# Patient Record
Sex: Male | Born: 1935 | Race: White | Hispanic: No
Health system: Southern US, Community
[De-identification: ages and names within clinical notes are randomized; demographics above are authoritative.]

---

## 2004-01-30 ENCOUNTER — Ambulatory Visit: Payer: Self-pay | Admitting: Internal Medicine

## 2004-03-01 ENCOUNTER — Ambulatory Visit: Payer: Self-pay | Admitting: Internal Medicine

## 2004-03-31 ENCOUNTER — Ambulatory Visit: Payer: Self-pay | Admitting: Internal Medicine

## 2004-05-01 ENCOUNTER — Ambulatory Visit: Payer: Self-pay | Admitting: Internal Medicine

## 2004-06-01 ENCOUNTER — Ambulatory Visit: Payer: Self-pay | Admitting: Internal Medicine

## 2004-06-29 ENCOUNTER — Ambulatory Visit: Payer: Self-pay | Admitting: Internal Medicine

## 2004-07-30 ENCOUNTER — Ambulatory Visit: Payer: Self-pay | Admitting: Internal Medicine

## 2004-08-29 ENCOUNTER — Ambulatory Visit: Payer: Self-pay | Admitting: Internal Medicine

## 2004-09-29 ENCOUNTER — Ambulatory Visit: Payer: Self-pay | Admitting: Internal Medicine

## 2004-11-15 ENCOUNTER — Ambulatory Visit: Payer: Self-pay | Admitting: Internal Medicine

## 2004-11-29 ENCOUNTER — Ambulatory Visit: Payer: Self-pay | Admitting: Internal Medicine

## 2005-01-18 ENCOUNTER — Ambulatory Visit: Payer: Self-pay | Admitting: Internal Medicine

## 2005-01-29 ENCOUNTER — Ambulatory Visit: Payer: Self-pay | Admitting: Internal Medicine

## 2005-03-14 ENCOUNTER — Ambulatory Visit: Payer: Self-pay | Admitting: Internal Medicine

## 2005-03-31 ENCOUNTER — Ambulatory Visit: Payer: Self-pay | Admitting: Internal Medicine

## 2005-05-16 ENCOUNTER — Ambulatory Visit: Payer: Self-pay | Admitting: Internal Medicine

## 2005-06-01 ENCOUNTER — Ambulatory Visit: Payer: Self-pay | Admitting: Internal Medicine

## 2005-09-12 ENCOUNTER — Ambulatory Visit: Payer: Self-pay | Admitting: Internal Medicine

## 2005-09-29 ENCOUNTER — Ambulatory Visit: Payer: Self-pay | Admitting: Internal Medicine

## 2005-10-12 ENCOUNTER — Ambulatory Visit: Payer: Self-pay | Admitting: Gastroenterology

## 2006-03-30 ENCOUNTER — Ambulatory Visit: Payer: Self-pay | Admitting: Internal Medicine

## 2008-09-27 ENCOUNTER — Emergency Department: Payer: Self-pay | Admitting: Emergency Medicine

## 2008-10-05 ENCOUNTER — Ambulatory Visit: Payer: Self-pay | Admitting: Emergency Medicine

## 2009-10-22 ENCOUNTER — Inpatient Hospital Stay: Payer: Self-pay | Admitting: Internal Medicine

## 2010-03-26 ENCOUNTER — Inpatient Hospital Stay: Payer: Self-pay | Admitting: Internal Medicine

## 2010-03-28 ENCOUNTER — Ambulatory Visit: Payer: Self-pay | Admitting: Internal Medicine

## 2010-03-31 ENCOUNTER — Ambulatory Visit: Payer: Self-pay | Admitting: Internal Medicine

## 2010-04-07 ENCOUNTER — Ambulatory Visit: Payer: Self-pay | Admitting: Internal Medicine

## 2010-05-01 ENCOUNTER — Ambulatory Visit: Payer: Self-pay | Admitting: Internal Medicine

## 2010-06-01 ENCOUNTER — Ambulatory Visit: Payer: Self-pay | Admitting: Internal Medicine

## 2011-03-06 ENCOUNTER — Ambulatory Visit: Payer: Self-pay | Admitting: Internal Medicine

## 2011-04-03 ENCOUNTER — Ambulatory Visit: Payer: Self-pay | Admitting: Oncology

## 2011-04-12 ENCOUNTER — Ambulatory Visit: Payer: Self-pay | Admitting: Oncology

## 2011-04-18 ENCOUNTER — Ambulatory Visit: Payer: Self-pay | Admitting: Oncology

## 2011-05-02 ENCOUNTER — Ambulatory Visit: Payer: Self-pay | Admitting: Oncology

## 2011-05-25 LAB — CBC CANCER CENTER
Eosinophil #: 0.1 x10 3/mm (ref 0.0–0.7)
Eosinophil %: 1.3 %
Lymphocyte #: 1.6 x10 3/mm (ref 1.0–3.6)
MCHC: 34.4 g/dL (ref 32.0–36.0)
MCV: 95 fL (ref 80–100)
Monocyte %: 9.2 %
Platelet: 122 x10 3/mm — ABNORMAL LOW (ref 150–440)
RBC: 3.94 10*6/uL — ABNORMAL LOW (ref 4.40–5.90)
RDW: 14.9 % — ABNORMAL HIGH (ref 11.5–14.5)
WBC: 7.5 x10 3/mm (ref 3.8–10.6)

## 2011-06-02 ENCOUNTER — Ambulatory Visit: Payer: Self-pay | Admitting: Oncology

## 2011-06-30 ENCOUNTER — Ambulatory Visit: Payer: Self-pay | Admitting: Oncology

## 2011-07-31 ENCOUNTER — Ambulatory Visit: Payer: Self-pay | Admitting: Oncology

## 2011-12-04 ENCOUNTER — Ambulatory Visit: Payer: Self-pay | Admitting: Oncology

## 2011-12-31 ENCOUNTER — Ambulatory Visit: Payer: Self-pay | Admitting: Oncology

## 2013-09-29 ENCOUNTER — Ambulatory Visit: Payer: Self-pay | Admitting: Internal Medicine

## 2013-10-18 ENCOUNTER — Inpatient Hospital Stay: Payer: Self-pay | Admitting: Internal Medicine

## 2013-10-18 LAB — CBC
HCT: 35.5 % — ABNORMAL LOW (ref 40.0–52.0)
HGB: 11.4 g/dL — ABNORMAL LOW (ref 13.0–18.0)
MCH: 28.8 pg (ref 26.0–34.0)
MCHC: 32 g/dL (ref 32.0–36.0)
MCV: 90 fL (ref 80–100)
PLATELETS: 144 10*3/uL — AB (ref 150–440)
RBC: 3.94 10*6/uL — AB (ref 4.40–5.90)
RDW: 14.4 % (ref 11.5–14.5)
WBC: 13.1 10*3/uL — ABNORMAL HIGH (ref 3.8–10.6)

## 2013-10-18 LAB — BASIC METABOLIC PANEL
Anion Gap: 7 (ref 7–16)
BUN: 31 mg/dL — ABNORMAL HIGH (ref 7–18)
CHLORIDE: 102 mmol/L (ref 98–107)
Calcium, Total: 8.7 mg/dL (ref 8.5–10.1)
Co2: 28 mmol/L (ref 21–32)
Creatinine: 2.03 mg/dL — ABNORMAL HIGH (ref 0.60–1.30)
EGFR (African American): 33 — ABNORMAL LOW
GFR CALC NON AF AMER: 29 — AB
GLUCOSE: 138 mg/dL — AB (ref 65–99)
Osmolality: 283 (ref 275–301)
POTASSIUM: 3.9 mmol/L (ref 3.5–5.1)
Sodium: 137 mmol/L (ref 136–145)

## 2013-10-18 LAB — CK TOTAL AND CKMB (NOT AT ARMC)
CK, TOTAL: 77 U/L
CK, Total: 100 U/L
CK-MB: 1 ng/mL (ref 0.5–3.6)
CK-MB: 1.1 ng/mL (ref 0.5–3.6)

## 2013-10-18 LAB — URINALYSIS, COMPLETE
BACTERIA: NONE SEEN
Bilirubin,UR: NEGATIVE
Blood: NEGATIVE
Glucose,UR: NEGATIVE mg/dL (ref 0–75)
KETONE: NEGATIVE
LEUKOCYTE ESTERASE: NEGATIVE
NITRITE: NEGATIVE
PH: 5 (ref 4.5–8.0)
Protein: 30
RBC,UR: 2 /HPF (ref 0–5)
SPECIFIC GRAVITY: 1.018 (ref 1.003–1.030)
SQUAMOUS EPITHELIAL: NONE SEEN

## 2013-10-18 LAB — PHOSPHORUS: PHOSPHORUS: 2.7 mg/dL (ref 2.5–4.9)

## 2013-10-18 LAB — HEPATIC FUNCTION PANEL A (ARMC)
ALBUMIN: 3.4 g/dL (ref 3.4–5.0)
ALK PHOS: 82 U/L
ALT: 19 U/L (ref 12–78)
BILIRUBIN DIRECT: 0.3 mg/dL — AB (ref 0.00–0.20)
BILIRUBIN TOTAL: 1.2 mg/dL — AB (ref 0.2–1.0)
SGOT(AST): 27 U/L (ref 15–37)
Total Protein: 7.7 g/dL (ref 6.4–8.2)

## 2013-10-18 LAB — TROPONIN I
TROPONIN-I: 0.2 ng/mL — AB
Troponin-I: 0.15 ng/mL — ABNORMAL HIGH

## 2013-10-18 LAB — LIPASE, BLOOD: LIPASE: 112 U/L (ref 73–393)

## 2013-10-18 LAB — MAGNESIUM: Magnesium: 2.1 mg/dL

## 2013-10-19 LAB — BASIC METABOLIC PANEL
Anion Gap: 6 — ABNORMAL LOW (ref 7–16)
BUN: 31 mg/dL — ABNORMAL HIGH (ref 7–18)
CO2: 26 mmol/L (ref 21–32)
Calcium, Total: 8.1 mg/dL — ABNORMAL LOW (ref 8.5–10.1)
Chloride: 105 mmol/L (ref 98–107)
Creatinine: 1.88 mg/dL — ABNORMAL HIGH (ref 0.60–1.30)
EGFR (African American): 36 — ABNORMAL LOW
EGFR (Non-African Amer.): 31 — ABNORMAL LOW
Glucose: 107 mg/dL — ABNORMAL HIGH (ref 65–99)
Osmolality: 281 (ref 275–301)
Potassium: 3.7 mmol/L (ref 3.5–5.1)
Sodium: 137 mmol/L (ref 136–145)

## 2013-10-19 LAB — CBC WITH DIFFERENTIAL/PLATELET
Basophil #: 0 10*3/uL (ref 0.0–0.1)
Basophil %: 0.3 %
EOS ABS: 0 10*3/uL (ref 0.0–0.7)
Eosinophil %: 0.3 %
HCT: 30.1 % — AB (ref 40.0–52.0)
HGB: 10.1 g/dL — AB (ref 13.0–18.0)
LYMPHS ABS: 2 10*3/uL (ref 1.0–3.6)
Lymphocyte %: 17.9 %
MCH: 30.2 pg (ref 26.0–34.0)
MCHC: 33.5 g/dL (ref 32.0–36.0)
MCV: 90 fL (ref 80–100)
Monocyte #: 1.4 x10 3/mm — ABNORMAL HIGH (ref 0.2–1.0)
Monocyte %: 12.9 %
NEUTROS ABS: 7.5 10*3/uL — AB (ref 1.4–6.5)
Neutrophil %: 68.6 %
Platelet: 110 10*3/uL — ABNORMAL LOW (ref 150–440)
RBC: 3.35 10*6/uL — ABNORMAL LOW (ref 4.40–5.90)
RDW: 14.6 % — ABNORMAL HIGH (ref 11.5–14.5)
WBC: 10.9 10*3/uL — AB (ref 3.8–10.6)

## 2013-10-19 LAB — CK TOTAL AND CKMB (NOT AT ARMC)
CK, Total: 77 U/L
CK-MB: 1.2 ng/mL (ref 0.5–3.6)

## 2013-10-19 LAB — TROPONIN I: TROPONIN-I: 0.18 ng/mL — AB

## 2013-10-19 LAB — DIGOXIN LEVEL: Digoxin: 2 ng/mL

## 2013-10-20 LAB — PLATELET COUNT: PLATELETS: 116 10*3/uL — AB (ref 150–440)

## 2013-10-20 LAB — URINE CULTURE

## 2013-10-21 LAB — BASIC METABOLIC PANEL
Anion Gap: 9 (ref 7–16)
BUN: 47 mg/dL — AB (ref 7–18)
CALCIUM: 8.5 mg/dL (ref 8.5–10.1)
CREATININE: 1.92 mg/dL — AB (ref 0.60–1.30)
Chloride: 108 mmol/L — ABNORMAL HIGH (ref 98–107)
Co2: 22 mmol/L (ref 21–32)
EGFR (Non-African Amer.): 31 — ABNORMAL LOW
GFR CALC AF AMER: 35 — AB
Glucose: 130 mg/dL — ABNORMAL HIGH (ref 65–99)
OSMOLALITY: 292 (ref 275–301)
Potassium: 4.2 mmol/L (ref 3.5–5.1)
Sodium: 139 mmol/L (ref 136–145)

## 2013-10-23 LAB — CULTURE, BLOOD (SINGLE)

## 2013-10-29 ENCOUNTER — Ambulatory Visit: Payer: Self-pay | Admitting: Internal Medicine

## 2013-12-30 DEATH — deceased

## 2014-08-22 NOTE — Consult Note (Signed)
   Comments   Spoke with daughter, Rolene CourseKaren Garrison, by phone.  The plan is for pt to return home and get him back in his home and familiar environment. Family will continue with pt's normal 24/7 caregivers in home. Daughter agrees father is not currently hospice appropriate but understands that if pt declines once home, he may be appropriate for home hospice.  Family would speak to pt's PCP and would ask for a consult at that time.  Electronic Signatures: Reather LaurenceMantzouris, Stephen J (NP)  (Signed 24-Jun-15 14:24)  Authored: Palliative Care Phifer, Harriett SineNancy (MD)  (Signed 24-Jun-15 21:38)  Authored: Palliative Care   Last Updated: 24-Jun-15 21:38 by Phifer, Harriett SineNancy (MD)

## 2014-08-22 NOTE — H&P (Signed)
PATIENT NAME:  Cory Chavez, Cory Chavez MR#:  [id number] DATE OF BIRTH:  09-27-1924  DATE OF ADMISSION:  10/18/2013  REFERRING PHYSICIAN: Dr. York Cerise  FAMILY PHYSICIAN: Dr. Yates Decamp III   REASON FOR ADMISSION: Altered mental status.   HISTORY OF PRESENT ILLNESS: The patient is an 81 year old male with a history of Alzheimer's dementia, benign hypertension, hyperlipidemia, and coronary artery disease. He  lives at home with caretakers that look after him 24 hours a day, 7 days a week. Has chronic confusion. Presents to the Emergency Room today with worsening confusion, lethargy, and tremor. Has been having fevers. In the Emergency Room, the patient was febrile and extremely confused. His white count was elevated. His troponin was elevated. He is now admitted for further evaluation. The patient is unable to give history.   PAST MEDICAL HISTORY: 1.  ASCVD, status post PTCA with stent placement.  2.  Alzheimer's dementia.  3.  Benign hypertension.  4.  Hyperlipidemia.  5.  Chronic bronchitis.  6.  History of renal failure.  7.  Osteoarthritis.  8.  History of lymphoid cancer of unclear etiology.   MEDICATIONS ON ADMISSION:  1.  Slow Fe 1 p.o. b.i.d.  2.  Nexium 40 mg p.o. b.i.d.  3.  Multivitamin 1 p.o. daily.  4.  Meclizine 25 mg p.o. t.i.d.  5.  Lasix 20 mg p.o. daily.  6.  Lanoxin 0.125 mg p.o. daily. 7.  Pletal 100 mg p.o. b.i.d.  8.  Coreg 6.25 mg p.o. b.i.d.  9.  Cardura 4 mg p.o. daily.  10.  Ativan 0.5 mg p.o. t.i.d. p.r.n.  11.  Aspirin 81 mg p.o. daily.  12.  Aricept 5 mg p.o. at bedtime.   ALLERGIES: PENICILLIN and AMBIEN.   SOCIAL HISTORY: The patient is a former smoker. No history of alcohol abuse.   FAMILY HISTORY: Noncontributory.   REVIEW OF SYSTEMS:  Unable to obtain.   PHYSICAL EXAMINATION: GENERAL: The patient is confused, agitated, in no acute distress.  VITAL SIGNS: Remarkable for a blood pressure of 140/66, heart rate 95, respiratory rate 24, temperature of  101.8, sat 97% on room air.  HEENT: Normocephalic, atraumatic. Pupils equally round and reactive to light and accommodation. Extraocular movements are intact. Sclerae not icteric. Conjunctivae are clear.  Oropharynx is dry, but clear.  NECK: Supple, without JVD. No adenopathy or thyromegaly is noted.  LUNGS: Reveal basilar rhonchi, without wheezes or rales. No dullness. Respiratory effort is normal.  CARDIAC: Regular rate and rhythm, with an occasional premature beat. Normal S1, S2. No significant rubs or gallops. PMI is nondisplaced. Chest wall is nontender.  ABDOMEN: Soft, nontender, with normoactive bowel sounds. No organomegaly or masses were appreciated. No hernias or bruits were noted.  EXTREMITIES: Without clubbing or cyanosis, 1+ edema was present.  SKIN: Warm and dry, without rash or lesions.  NEUROLOGIC: Exam revealed cranial nerves II through XII grossly intact. Deep tendon reflexes were symmetric. Motor and sensory exams nonfocal.  PSYCHIATRIC: Exam revealed a patient who was alert, but disoriented to person, place and time, and could not answer questions appropriately.   LABORATORY DATA:  EKG revealed sinus rhythm with PVCs and a left bundle branch block. Chest x-ray revealed no evidence of infiltrate. Urinalysis was unremarkable. His white count was 13.1, with a hemoglobin of 11.4. Troponin was 0.15. Glucose 138, with a BUN of 31, creatinine 2.03, and a GFR of 29.   ASSESSMENT: 1.  Febrile illness of unclear significance.  2.  Elevated troponin.  3.  Abnormal EKG.  4.  Altered mental status.  5.  Dehydration.  6.  Stage III chronic kidney disease.   PLAN: The patient will be admitted as a FULL CODE according to the power of attorney. She will discuss his code status further with family, but for now he is FULL CODE. He will be maintained on IV fluids with IV antibiotics. Cultures have been sent. Will follow serial cardiac enzymes and obtain an echocardiogram. Will consult  Cardiology because of his troponin elevation and abnormal EKG. Will obtain a social work consult for placement and a palliative care consult, given the patient's progressive dementia and history of heart disease. Follow up routine labs in the morning. Soft diet for now. Further treatment and evaluation will depend upon the patient's progress.   Total time spent on this patient was 50 minutes.    ____________________________ Duane LopeJeffrey D. Judithann SheenSparks, MD jds:mr D: 10/18/2013 18:04:35 ET T: 10/18/2013 19:12:53 ET JOB#: [id number]  cc: Duane LopeJeffrey D. Judithann SheenSparks, MD, <Dictator> John B. Danne HarborWalker III, MD  Darron Stuck Rodena Medin Meleni Delahunt MD ELECTRONICALLY SIGNED 10/19/2013 10:26

## 2014-08-22 NOTE — Discharge Summary (Signed)
PATIENT NAME:  Cory Chavez, Cory Chavez MR#:  [id number] DATE OF BIRTH:  [birth year]  DATE OF ADMISSION:  10/18/2013 DATE OF DISCHARGE:  10/23/2013  DISCHARGE DIAGNOSES:  1.  Encephalopathy. 2.  Monoarticular arthritis, likely gout, responding to steroids.  3.  Febrile illness, nonspecific source, rule out ehrlichiosis.  4.  Digoxin toxicity.  5.  Accelerated  hypertension, likely from encephalopathy.  6.  Dementia, progressive, nearing end-stage.   DISCHARGE MEDICATIONS: [facility]. Briefly, we will stop the digoxin, given upper limit normal levels and lack of atrial fibrillation and his renal insufficiency. He will do 3 more days of doxycycline, 5 more days of prednisone at 20 mg.   HISTORY AND PHYSICAL: Please see detailed history and physical done on admission.   HOSPITAL COURSE: The patient was admitted, more confused, febrile. Right wrist was swollen and red, very painful. Steroids were started, he did well. Work-up of the fever was, otherwise, negative. He was covered with Levaquin and doxycycline. He improved, though he is still more confused than baseline.  Could not ambulate, had been ambulating prior to this. His renal function was around baseline, GFR of 30-40. He had echocardiogram that showed decreased LV function, though he had no signs of acute CHF in the hospital. Troponin levels were minimally elevated on admission, likely secondary to his renal insufficiency without signs of MI, no chest pain, shortness of breath, etc.  We discharged with home health, 24/7 sitters have already been on the case. He will follow-up soon with Dr. Dan HumphreysWalker, his primary care.   TIME SPENT: It took approximately 35 minutes to do all discharge tasks.    ____________________________ Marya AmslerMarshall W. Dareen PianoAnderson, MD mwa:ts D: 10/23/2013 07:51:33 ET T: 10/23/2013 12:44:00 ET JOB#: [id number]  cc: Marya AmslerMarshall W. Dareen PianoAnderson, MD, <Dictator> Lauro RegulusMARSHALL W ANDERSON MD ELECTRONICALLY SIGNED 10/27/2013 7:47

## 2014-08-22 NOTE — Consult Note (Signed)
Brief Consult Note: Comments: 81 yo male with  a history of Alzheimer's dementia, benign hypertension, hyperlipidemia, and coronary artery disease who was admitted with progressive weakness and confusion. Not able to give history . Caregivers report urinary frequency and possible fevers. Does not appear to be in chf clinnically. CXR does not suggest signficant chf. Ruled out for mi thus far. Continue to treat possible fuo. Does not appear to requrie further cardiac workup.  Electronic Signatures: Dalia HeadingFath, Kenneth A (MD)  (Signed 22-Jun-15 07:10)  Authored: Brief Consult Note   Last Updated: 22-Jun-15 07:10 by Dalia HeadingFath, Kenneth A (MD)

## 2014-08-22 NOTE — Consult Note (Signed)
   Comments   Spoke with daughter, Rolene CourseKaren Garrison, by phone.  Clydie BraunKaren is HCPOA and agrees to make father a limited code.  Meaning pt will only receive shocks and compression.  Clydie BraunKaren wishes pt not to be intubated.  Clydie BraunKaren also states this is something the whole family would agree upon.  Electronic Signatures: Reather LaurenceMantzouris, Stephen J (NP)  (Signed 22-Jun-15 13:26)  Authored: Palliative Care Phifer, Harriett SineNancy (MD)  (Signed 22-Jun-15 20:49)  Authored: Palliative Care   Last Updated: 22-Jun-15 20:49 by Phifer, Harriett SineNancy (MD)

## 2014-10-27 IMAGING — CR RIGHT HAND - COMPLETE 3+ VIEW
1 series · 3 of 3 positions shown · non-contrast
Comparison: None.

CLINICAL DATA: Right hand pain and swelling.

EXAM:
RIGHT HAND - COMPLETE 3+ VIEW

[Series 1: pa · 0.17mm/px · 3 of 3 slices shown]
[im 1/3]
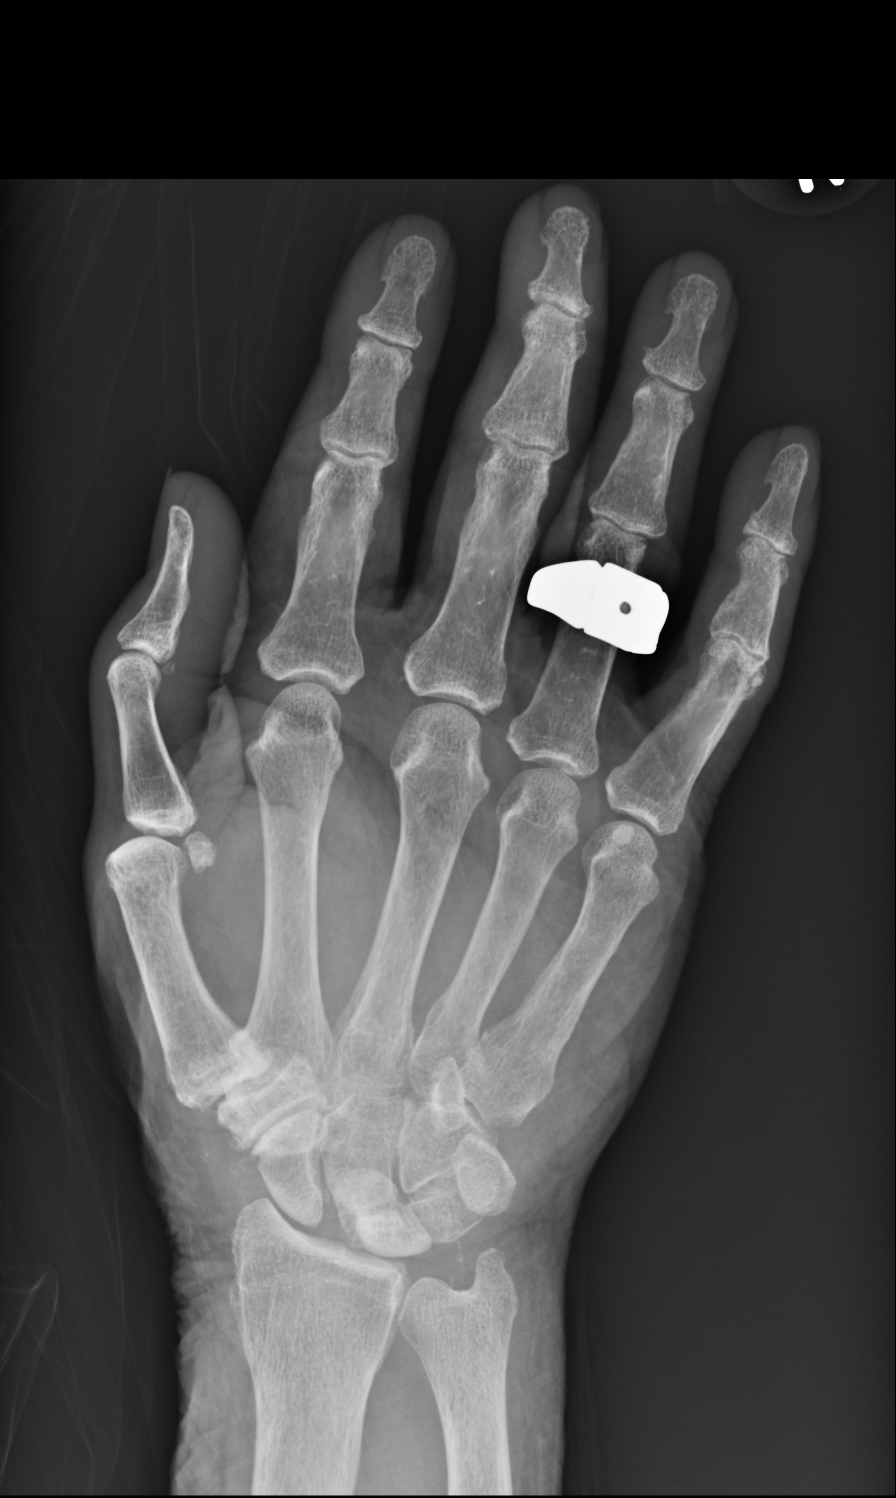
[im 2/3]
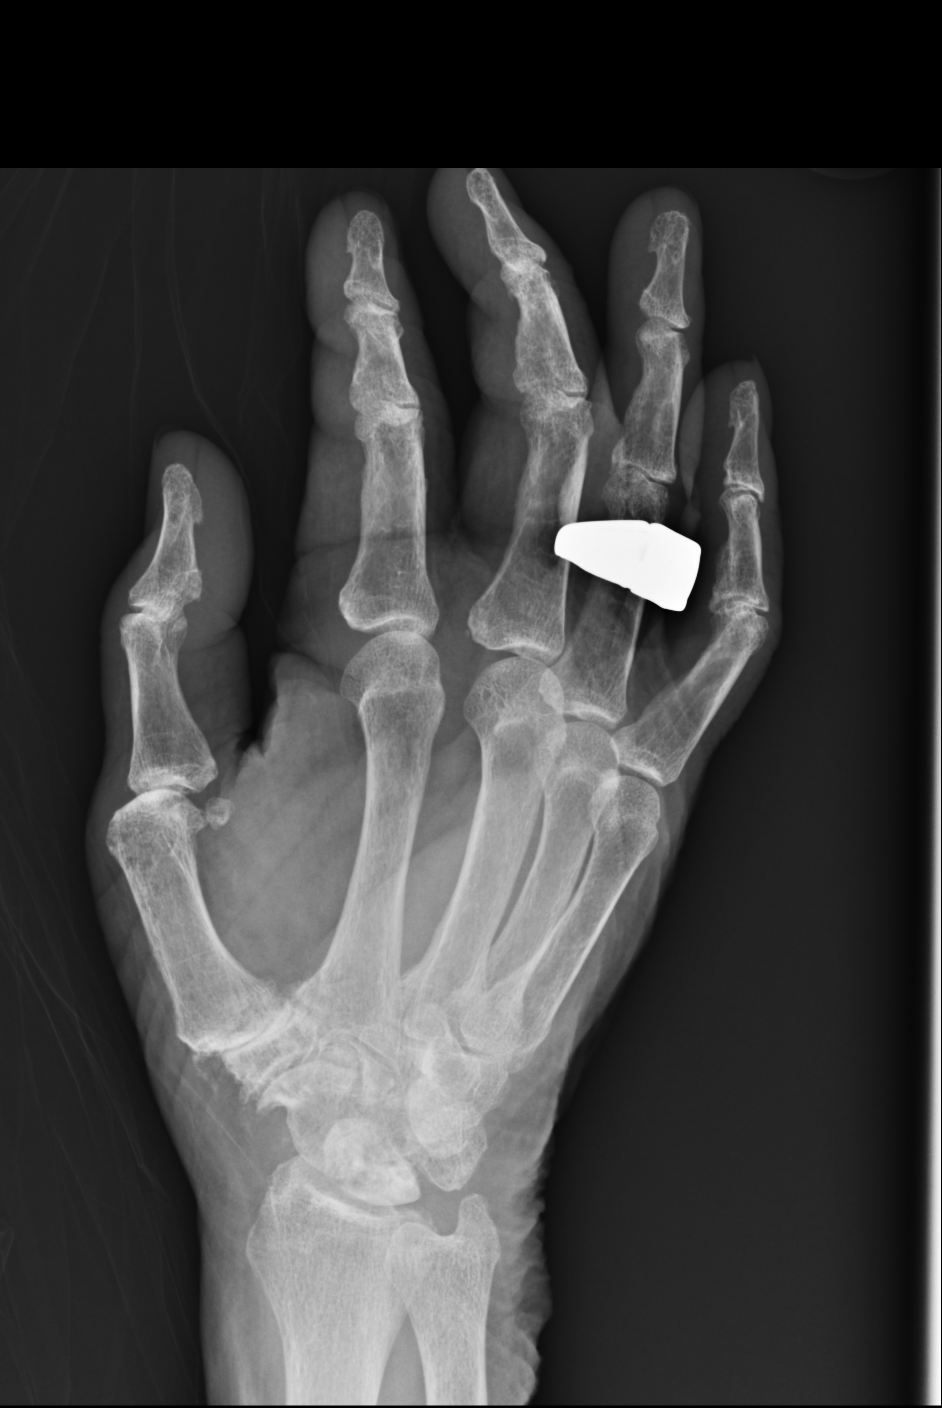
[im 3/3]
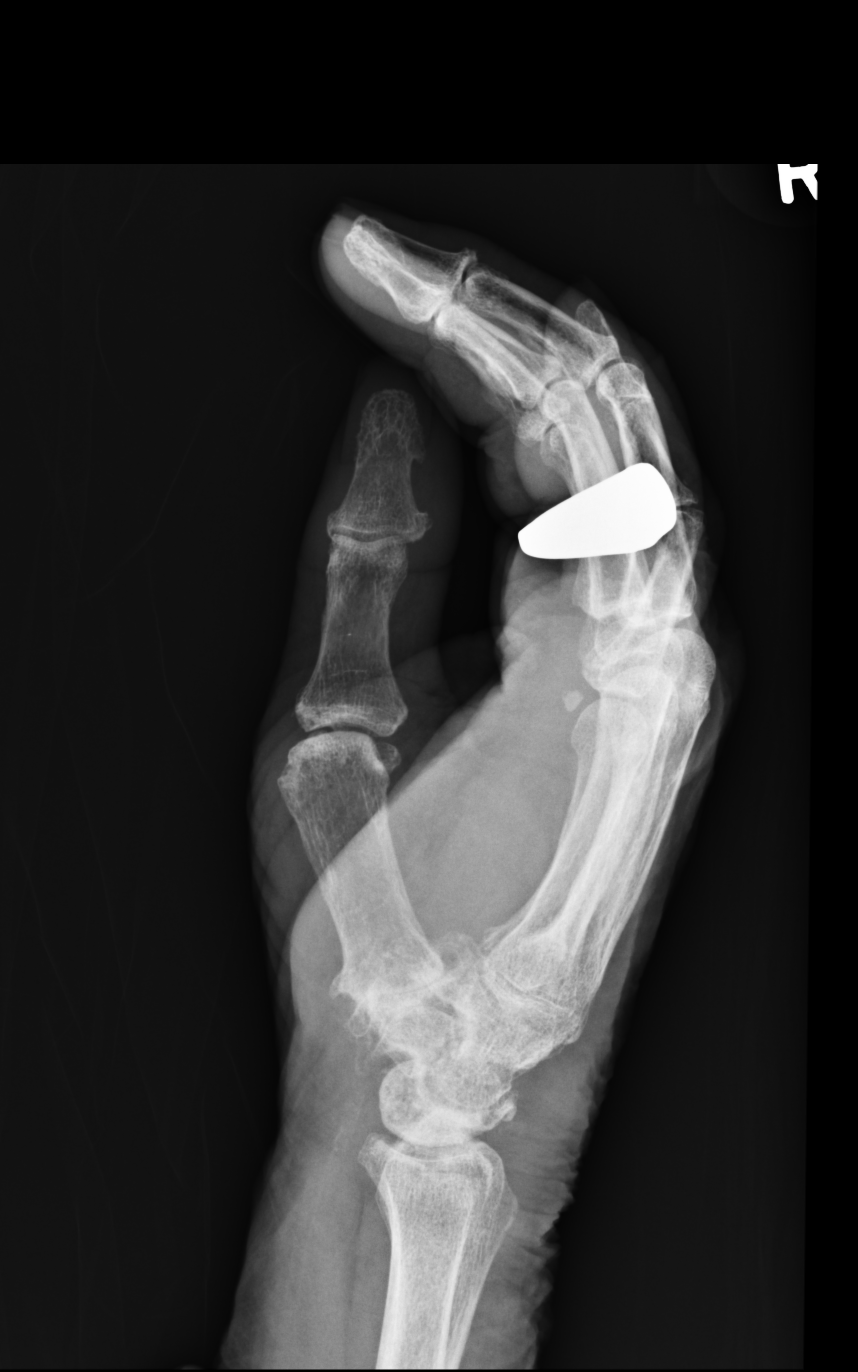

[3 of 3 positions shown; findings below may reference images not displayed]

FINDINGS: There is no evidence of acute fracture, subluxation or dislocation.

Moderate degenerative changes in the lateral wrist are noted.

Scapholunate dissociation and lunate AVN identified.
IMPRESSION: No evidence of acute bony abnormality.

Degenerative changes within the lateral wrist, scapholunate
dissociation and lunate AVN.

## 2014-10-27 IMAGING — CR DG CHEST 1V PORT
1 series · 1 of 1 positions shown · non-contrast
Comparison: Portable chest radiograph 03/28/2010

CLINICAL DATA: pt here for fall, AMS and cough

Is
EXAM:
PORTABLE CHEST - 1 VIEW

[ap]
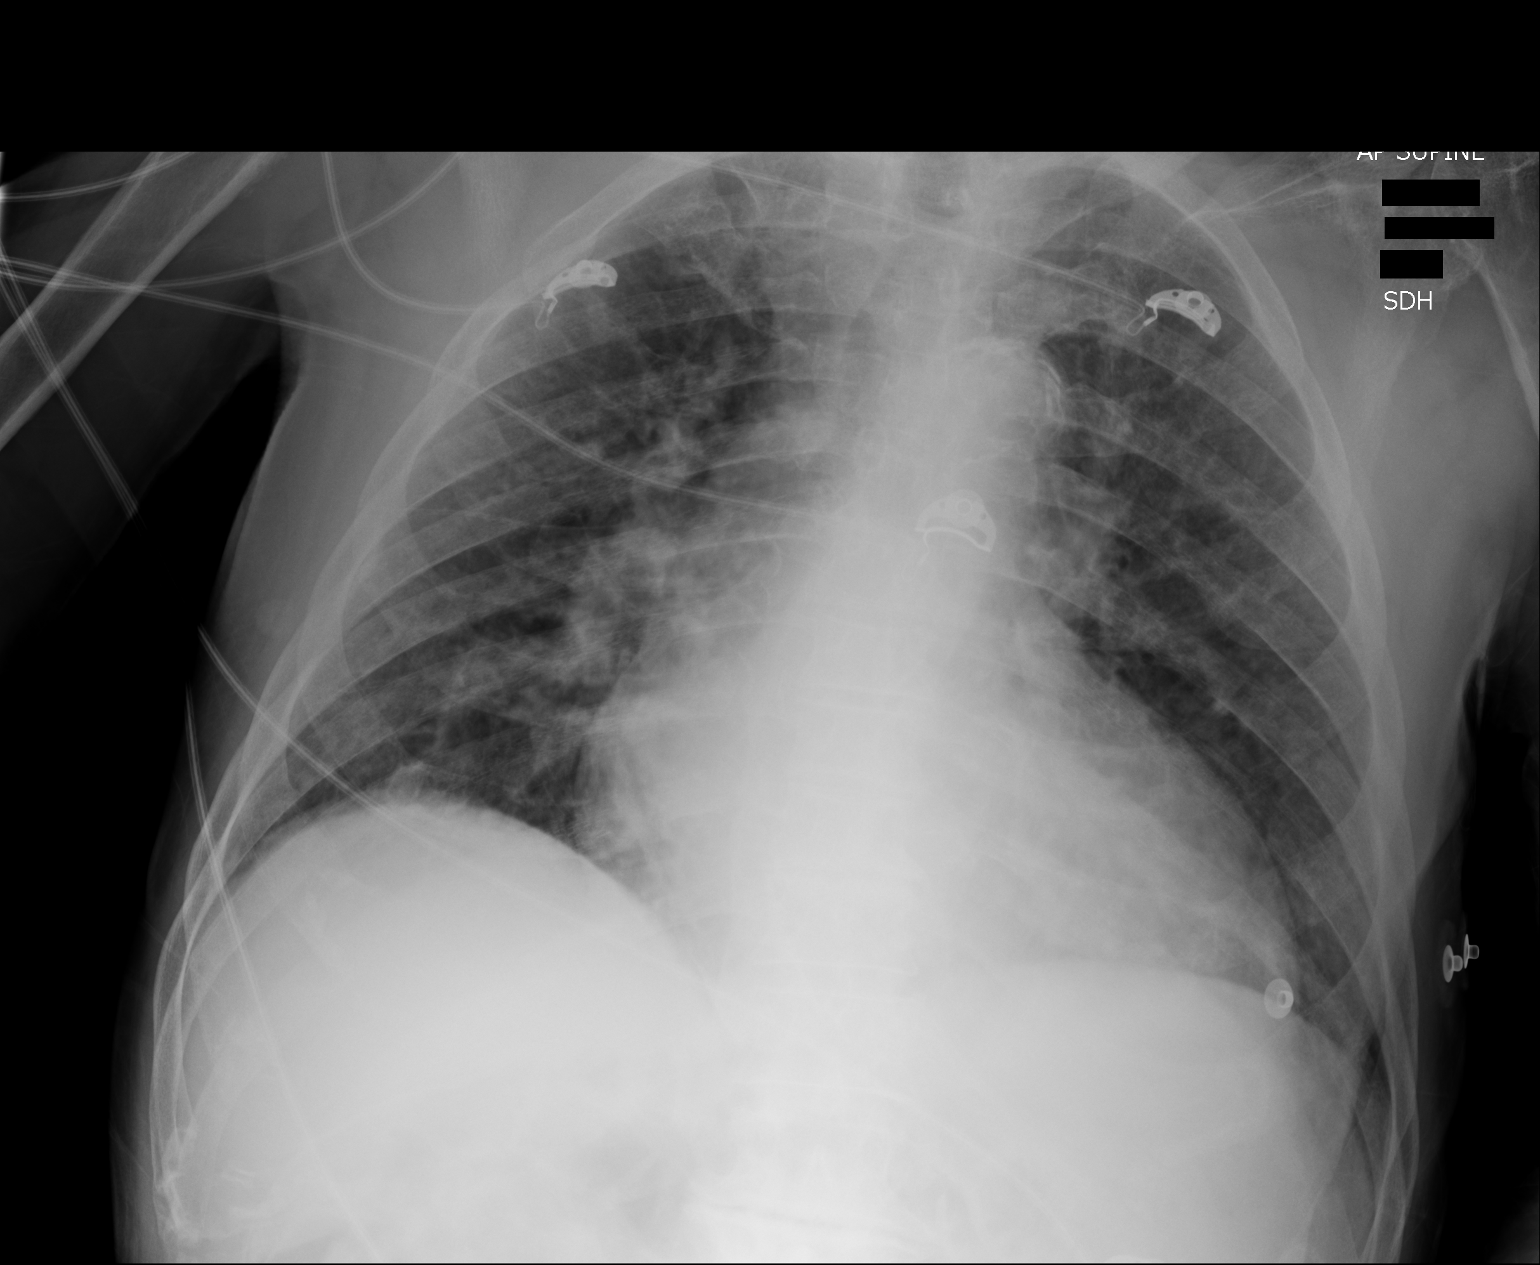

[1 of 1 positions shown; findings below may reference images not displayed]

FINDINGS: Stable cardiomegaly. Atherosclerotic calcification in the aorta.
Mild prominence of interstitial markings. No focal regions of
consolidation or focal infiltrates. Degenerative changes within the
shoulders.
IMPRESSION: Pulmonary vascular congestion no focal regions of consolidation or
focal infiltrates.
